# Patient Record
Sex: Male | Born: 1955 | Race: White | Hispanic: No | Marital: Married | Smoking: Never smoker
Health system: Southern US, Community
[De-identification: ages and names within clinical notes are randomized; demographics above are authoritative.]

---

## 1994-05-22 HISTORY — PX: VASECTOMY: SHX75

## 2006-01-06 ENCOUNTER — Emergency Department: Payer: Self-pay | Admitting: Emergency Medicine

## 2010-05-22 HISTORY — PX: OTHER SURGICAL HISTORY: SHX169

## 2020-10-22 ENCOUNTER — Other Ambulatory Visit: Payer: Self-pay | Admitting: Surgery

## 2020-11-03 ENCOUNTER — Other Ambulatory Visit
Admission: RE | Admit: 2020-11-03 | Discharge: 2020-11-03 | Disposition: A | Discharge status: Home / Self Care | Payer: Commercial Managed Care - HMO | Source: Ambulatory Visit | Attending: Surgery | Admitting: Surgery

## 2020-11-03 ENCOUNTER — Other Ambulatory Visit: Payer: Self-pay

## 2020-11-03 NOTE — Patient Instructions (Signed)
Your procedure is scheduled on: Wednesday November 10, 2020. Report to Day [facility] inside [facility] 2nd floor (stop by admissions desk first before getting on elevator) To find out your arrival time please call [phone] between 1PM - 3PM on Tuesday November 09, 2020.  Remember: Instructions that are not followed completely may result in serious medical risk,  up to and including death, or upon the discretion of your surgeon and anesthesiologist your  surgery may need to be rescheduled.     _X__ 1. Do not eat food after midnight the night before your procedure.                 No chewing gum or hard candies. You may drink clear liquids up to 2 hours                 before you are scheduled to arrive for your surgery- DO not drink clear                 liquids within 2 hours of the start of your surgery.                 Clear Liquids include:  water, apple juice without pulp, clear Gatorade, G2 or                  Gatorade Zero (avoid Red/Purple/Blue), Black Coffee or Tea (Do not add                 anything to coffee or tea).  __X__2.  On the morning of surgery brush your teeth with toothpaste and water, you                may rinse your mouth with mouthwash if you wish.  Do not swallow any toothpaste of mouthwash.     _X__ 3.  No Alcohol for 24 hours before or after surgery.   _X__ 4.  Do Not Smoke or use e-cigarettes For 24 Hours Prior to Your Surgery.                 Do not use any chewable tobacco products for at least 6 hours prior to                 Surgery.  _X__  5.  Do not use any recreational drugs (marijuana, cocaine, heroin, ecstasy, MDMA or other)                For at least one week prior to your surgery.  Combination of these drugs with anesthesia                May have life threatening results.  __X__6.  Notify your doctor if there is any change in your medical condition      (cold, fever, infections).     Do not wear jewelry, make-up, hairpins,  clips or nail polish. Do not wear lotions, powders, or perfumes. You may wear deodorant. Do not shave 48 hours prior to surgery. Men may shave face and neck. Do not bring valuables to the hospital.    [facility] is not responsible for any belongings or valuables.  Contacts, dentures or bridgework may not be worn into surgery. Leave your suitcase in the car. After surgery it may be brought to your room. For patients admitted to the hospital, discharge time is determined by your treatment team.   Patients discharged the day of surgery will not be allowed to drive home.  Make arrangements for someone to be with you for the first 24 hours of your Same Day Discharge.  __X__ Take these medicines the morning of surgery with A SIP OF WATER:    1. None  2.   3.   4.  5.  6.  ____ Fleet Enema (as directed)   __X__ Use Antibacterial Soap as directed  ____ Use Benzoyl Peroxide Gel as instructed  ____ Use inhalers on the day of surgery  ____ Stop metformin 2 days prior to surgery    ____ Take 1/2 of usual insulin dose the night before surgery. No insulin the morning          of surgery.   ____ Call your PCP, cardiologist, or Pulmonologist if taking Coumadin/Plavix/aspirin and ask when to stop before your surgery.   __X__ One Week prior to surgery- Stop Anti-inflammatories such as Ibuprofen, Aleve, Advil, Motrin, meloxicam (MOBIC), diclofenac, etodolac, ketorolac, Toradol, Daypro, piroxicam, Goody's or BC powders. OK TO USE TYLENOL IF NEEDED   ____ Stop supplements until after surgery.    ____ Bring C-Pap to the hospital.    If you have any questions regarding your pre-procedure instructions,  Please call Pre-admit Testing at [phone].

## 2020-11-08 ENCOUNTER — Other Ambulatory Visit: Admission: RE | Admit: 2020-11-08 | Payer: Commercial Managed Care - HMO | Source: Ambulatory Visit

## 2020-11-10 ENCOUNTER — Ambulatory Visit: Admission: RE | Admit: 2020-11-10 | Payer: Commercial Managed Care - HMO | Source: Home / Self Care | Admitting: Surgery

## 2020-11-10 ENCOUNTER — Encounter: Admission: RE | Payer: Self-pay | Source: Home / Self Care

## 2020-11-10 SURGERY — ARTHROSCOPY, KNEE, WITH MEDIAL MENISCECTOMY
Anesthesia: Choice | Site: Knee | Laterality: Left

## 2021-01-20 ENCOUNTER — Other Ambulatory Visit: Payer: Self-pay | Admitting: Surgery

## 2021-01-20 DIAGNOSIS — S83232D Complex tear of medial meniscus, current injury, left knee, subsequent encounter: Secondary | ICD-10-CM

## 2021-02-01 ENCOUNTER — Ambulatory Visit
Admission: RE | Admit: 2021-02-01 | Discharge: 2021-02-01 | Disposition: A | Discharge status: Home / Self Care | Payer: Medicare Other | Source: Ambulatory Visit | Attending: Surgery | Admitting: Surgery

## 2021-02-01 ENCOUNTER — Other Ambulatory Visit: Payer: Self-pay

## 2021-02-01 DIAGNOSIS — S83232D Complex tear of medial meniscus, current injury, left knee, subsequent encounter: Secondary | ICD-10-CM | POA: Diagnosis present

## 2021-02-10 ENCOUNTER — Other Ambulatory Visit: Payer: Self-pay | Admitting: Surgery

## 2021-02-22 ENCOUNTER — Other Ambulatory Visit
Admission: RE | Admit: 2021-02-22 | Discharge: 2021-02-22 | Disposition: A | Discharge status: Home / Self Care | Payer: Medicare Other | Source: Ambulatory Visit | Attending: Surgery | Admitting: Surgery

## 2021-02-22 ENCOUNTER — Other Ambulatory Visit: Payer: Self-pay

## 2021-02-22 NOTE — Patient Instructions (Addendum)
Your procedure is scheduled on: Wednesday 03/02/21 Report to the Registration Desk on the 1st floor of the [facility]. To find out your arrival time, please call [phone] between 1PM - 3PM on: Tuesday 03/01/21  REMEMBER: Instructions that are not followed completely may result in serious medical risk, up to and including death; or upon the discretion of your surgeon and anesthesiologist your surgery may need to be rescheduled.  Do not eat food after midnight the night before surgery.  No gum chewing, lozengers or hard candies.  You may however, drink CLEAR liquids up to 2 hours before you are scheduled to arrive for your surgery. Do not drink anything within 2 hours of your scheduled arrival time.  Clear liquids include: - water  - apple juice without pulp - gatorade (not RED, PURPLE, OR BLUE) - black coffee or tea (Do NOT add milk or creamers to the coffee or tea) Do NOT drink anything that is not on this list.  Dr. Joice Lofts ordered a Pre-Surgery Carb drink for the morning of surgery. The drink has to be finished prior to the 2 hour arrival scheduled time.  One week prior to surgery: On Wednesday 02/23/21 stop taking Diclofenac Sodium Stop Anti-inflammatories (NSAIDS) such as Advil, Aleve, Ibuprofen, Motrin, Naproxen, Naprosyn and Aspirin based products such as Excedrin, Goodys Powder, BC Powder. Stop ANY OVER THE COUNTER supplements until after surgery. You may however, continue to take Tylenol if needed for pain up until the day of surgery.  No Alcohol for 24 hours before or after surgery.  No Smoking including e-cigarettes for 24 hours prior to surgery.  No chewable tobacco products for at least 6 hours prior to surgery.  No nicotine patches on the day of surgery.  Do not use any "recreational" drugs for at least a week prior to your surgery.  Please be advised that the combination of cocaine and anesthesia may have negative outcomes, up to and including death. If you test  positive for cocaine, your surgery will be cancelled.  On the morning of surgery brush your teeth with toothpaste and water, you may rinse your mouth with mouthwash if you wish. Do not swallow any toothpaste or mouthwash.  Use CHG Soap or wipes as directed on instruction sheet.  Do not wear jewelry.  Do not wear lotions, powders, or perfumes.   Do not shave body from the neck down 48 hours prior to surgery just in case you cut yourself which could leave a site for infection.  Also, freshly shaved skin may become irritated if using the CHG soap.  Do not bring valuables to the hospital. [facility] is not responsible for any missing/lost belongings or valuables.   Bring your C-PAP to the hospital with you in case you may have to spend the night.   Notify your doctor if there is any change in your medical condition (cold, fever, infection).  Wear comfortable clothing (specific to your surgery type) to the hospital.  After surgery, you can help prevent lung complications by doing breathing exercises.  Take deep breaths and cough every 1-2 hours. Your doctor may order a device called an Incentive Spirometer to help you take deep breaths.  If you are being discharged the day of surgery, you will not be allowed to drive home. You will need a responsible adult (18 years or older) to drive you home and stay with you that night.   If you are taking public transportation, you will need to have a responsible  adult (18 years or older) with you. Please confirm with your physician that it is acceptable to use public transportation.   Please call the Pre-admissions Testing Dept. at [phone] if you have any questions about these instructions.  Surgery Visitation Policy:  Patients undergoing a surgery or procedure may have one family member or support person with them as long as that person is not COVID-19 positive or experiencing its symptoms.  That person may remain in the waiting area  during the procedure and may rotate out with other people.  Inpatient Visitation:    Visiting hours are 7 a.m. to 8 p.m. Up to two visitors ages 16+ are allowed at one time in a patient room. The visitors may rotate out with other people during the day. Visitors must check out when they leave, or other visitors will not be allowed. One designated support person may remain overnight. The visitor must pass COVID-19 screenings, use hand sanitizer when entering and exiting the patient's room and wear a mask at all times, including in the patient's room. Patients must also wear a mask when staff or their visitor are in the room. Masking is required regardless of vaccination status.

## 2021-02-24 ENCOUNTER — Other Ambulatory Visit
Admission: RE | Admit: 2021-02-24 | Discharge: 2021-02-24 | Disposition: A | Discharge status: Home / Self Care | Payer: Medicare Other | Source: Ambulatory Visit | Attending: Surgery | Admitting: Surgery

## 2021-02-24 ENCOUNTER — Other Ambulatory Visit: Payer: Self-pay

## 2021-02-24 DIAGNOSIS — Z01810 Encounter for preprocedural cardiovascular examination: Secondary | ICD-10-CM | POA: Insufficient documentation

## 2021-03-01 ENCOUNTER — Other Ambulatory Visit: Payer: Medicare Other

## 2021-03-01 MED ORDER — ORAL CARE MOUTH RINSE: 15.0000 mL | Freq: Once | OROMUCOSAL | Status: AC

## 2021-03-01 MED ORDER — LACTATED RINGERS IV SOLN: INTRAVENOUS | Status: DC

## 2021-03-01 MED ORDER — CEFAZOLIN SODIUM-DEXTROSE 2-4 GM/100ML-% IV SOLN
2.0000 g | INTRAVENOUS | Status: DC
Start: 2021-03-02 — End: 2021-03-01

## 2021-03-01 MED ORDER — CEFAZOLIN SODIUM-DEXTROSE 2-4 GM/100ML-% IV SOLN
2.0000 g | INTRAVENOUS | Status: AC
  Administered 2021-03-02: 2 g via INTRAVENOUS

## 2021-03-01 MED ORDER — FAMOTIDINE 20 MG PO TABS: 20.0000 mg | ORAL_TABLET | Freq: Once | ORAL | Status: AC

## 2021-03-01 MED ORDER — CHLORHEXIDINE GLUCONATE 0.12 % MT SOLN
15.0000 mL | Freq: Once | OROMUCOSAL | Status: AC
Start: 2021-03-01 — End: 2021-03-02

## 2021-03-02 ENCOUNTER — Ambulatory Visit
Admission: RE | Admit: 2021-03-02 | Discharge: 2021-03-02 | Disposition: A | Discharge status: Home / Self Care | Payer: Medicare Other | Source: Ambulatory Visit | Attending: Surgery | Admitting: Surgery

## 2021-03-02 ENCOUNTER — Encounter: Payer: Self-pay | Admitting: Surgery

## 2021-03-02 ENCOUNTER — Other Ambulatory Visit: Payer: Self-pay

## 2021-03-02 ENCOUNTER — Ambulatory Visit: Payer: Medicare Other | Admitting: Anesthesiology

## 2021-03-02 ENCOUNTER — Encounter
Admission: RE | Disposition: A | Discharge status: Home / Self Care | Payer: Self-pay | Source: Ambulatory Visit | Attending: Surgery

## 2021-03-02 DIAGNOSIS — S83232A Complex tear of medial meniscus, current injury, left knee, initial encounter: Secondary | ICD-10-CM | POA: Insufficient documentation

## 2021-03-02 DIAGNOSIS — Z791 Long term (current) use of non-steroidal anti-inflammatories (NSAID): Secondary | ICD-10-CM | POA: Diagnosis not present

## 2021-03-02 DIAGNOSIS — X58XXXA Exposure to other specified factors, initial encounter: Secondary | ICD-10-CM | POA: Insufficient documentation

## 2021-03-02 HISTORY — PX: KNEE ARTHROSCOPY WITH MEDIAL MENISECTOMY: SHX5651

## 2021-03-02 SURGERY — ARTHROSCOPY, KNEE, WITH MEDIAL MENISCECTOMY
Anesthesia: General | Site: Knee | Laterality: Left

## 2021-03-02 MED ORDER — ACETAMINOPHEN 10 MG/ML IV SOLN
INTRAVENOUS | Status: AC
  Filled 2021-03-02: qty 100

## 2021-03-02 MED ORDER — MIDAZOLAM HCL 2 MG/2ML IJ SOLN
INTRAMUSCULAR | Status: AC
  Filled 2021-03-02: qty 2

## 2021-03-02 MED ORDER — FAMOTIDINE 20 MG PO TABS
ORAL_TABLET | ORAL | Status: AC
  Administered 2021-03-02: 20 mg via ORAL
  Filled 2021-03-02: qty 1

## 2021-03-02 MED ORDER — EPHEDRINE SULFATE 50 MG/ML IJ SOLN
INTRAMUSCULAR | Status: DC | PRN
Start: 2021-03-02 — End: 2021-03-02
  Administered 2021-03-02: 10 mg via INTRAVENOUS

## 2021-03-02 MED ORDER — LIDOCAINE HCL (CARDIAC) PF 100 MG/5ML IV SOSY
PREFILLED_SYRINGE | INTRAVENOUS | Status: DC | PRN
Start: 2021-03-02 — End: 2021-03-02
  Administered 2021-03-02: 100 mg via INTRAVENOUS

## 2021-03-02 MED ORDER — PHENYLEPHRINE HCL (PRESSORS) 10 MG/ML IV SOLN
INTRAVENOUS | Status: AC
  Filled 2021-03-02: qty 2

## 2021-03-02 MED ORDER — FENTANYL CITRATE (PF) 100 MCG/2ML IJ SOLN
INTRAMUSCULAR | Status: AC
  Filled 2021-03-02: qty 2

## 2021-03-02 MED ORDER — ACETAMINOPHEN 10 MG/ML IV SOLN
INTRAVENOUS | Status: DC | PRN
  Administered 2021-03-02: 1000 mg via INTRAVENOUS

## 2021-03-02 MED ORDER — PROPOFOL 1000 MG/100ML IV EMUL
INTRAVENOUS | Status: AC
  Filled 2021-03-02: qty 200

## 2021-03-02 MED ORDER — FENTANYL CITRATE (PF) 100 MCG/2ML IJ SOLN
INTRAMUSCULAR | Status: DC | PRN
  Administered 2021-03-02 (×4): 25 ug via INTRAVENOUS

## 2021-03-02 MED ORDER — BUPIVACAINE-EPINEPHRINE (PF) 0.5% -1:200000 IJ SOLN
INTRAMUSCULAR | Status: AC
  Filled 2021-03-02: qty 30

## 2021-03-02 MED ORDER — PROPOFOL 10 MG/ML IV BOLUS
INTRAVENOUS | Status: DC | PRN
  Administered 2021-03-02: 200 mg via INTRAVENOUS

## 2021-03-02 MED ORDER — CHLORHEXIDINE GLUCONATE 0.12 % MT SOLN
OROMUCOSAL | Status: AC
  Administered 2021-03-02: 15 mL via OROMUCOSAL
  Filled 2021-03-02: qty 15

## 2021-03-02 MED ORDER — RINGERS IRRIGATION IR SOLN
Status: DC | PRN
  Administered 2021-03-02: 3000 mL

## 2021-03-02 MED ORDER — LIDOCAINE HCL 1 % IJ SOLN
INTRAMUSCULAR | Status: DC | PRN
  Administered 2021-03-02: 60 mL via INTRAMUSCULAR

## 2021-03-02 MED ORDER — BUPIVACAINE-EPINEPHRINE (PF) 0.5% -1:200000 IJ SOLN
INTRAMUSCULAR | Status: DC | PRN
  Administered 2021-03-02: 30 mL

## 2021-03-02 MED ORDER — MIDAZOLAM HCL 2 MG/2ML IJ SOLN
INTRAMUSCULAR | Status: DC | PRN
  Administered 2021-03-02: 2 mg via INTRAVENOUS

## 2021-03-02 MED ORDER — LIDOCAINE HCL (PF) 1 % IJ SOLN
INTRAMUSCULAR | Status: AC
  Filled 2021-03-02: qty 30

## 2021-03-02 MED ORDER — ONDANSETRON HCL 4 MG/2ML IJ SOLN
INTRAMUSCULAR | Status: DC | PRN
  Administered 2021-03-02: 4 mg via INTRAVENOUS

## 2021-03-02 MED ORDER — CEFAZOLIN SODIUM-DEXTROSE 2-4 GM/100ML-% IV SOLN
INTRAVENOUS | Status: AC
  Filled 2021-03-02: qty 100

## 2021-03-02 MED ORDER — GLYCOPYRROLATE 0.2 MG/ML IJ SOLN
INTRAMUSCULAR | Status: DC | PRN
  Administered 2021-03-02: .2 mg via INTRAVENOUS

## 2021-03-02 MED ORDER — DEXAMETHASONE SODIUM PHOSPHATE 10 MG/ML IJ SOLN
INTRAMUSCULAR | Status: DC | PRN
  Administered 2021-03-02: 10 mg via INTRAVENOUS

## 2021-03-02 MED ORDER — HYDROCODONE-ACETAMINOPHEN 5-325 MG PO TABS: 1.0000 | ORAL_TABLET | Freq: Four times a day (QID) | ORAL | 0 refills | Status: AC | PRN

## 2021-03-02 MED ORDER — SODIUM CHLORIDE 0.9 % IV SOLN
INTRAVENOUS | Status: DC | PRN
  Administered 2021-03-02: 25 ug/min via INTRAVENOUS

## 2021-03-02 SURGICAL SUPPLY — 38 items
APL PRP STRL LF DISP 70% ISPRP (MISCELLANEOUS) ×1
BAG COUNTER SPONGE SURGICOUNT (BAG) IMPLANT
BAG SPNG CNTER NS LX DISP (BAG)
BLADE FULL RADIUS 3.5 (BLADE) ×2 IMPLANT
BLADE SHAVER 4.5X7 STR FR (MISCELLANEOUS) ×2 IMPLANT
BNDG ELASTIC 6X5.8 VLCR STR LF (GAUZE/BANDAGES/DRESSINGS) ×2 IMPLANT
BNDG ESMARK 6X12 TAN STRL LF (GAUZE/BANDAGES/DRESSINGS) ×2 IMPLANT
CHLORAPREP W/TINT 26 (MISCELLANEOUS) ×2 IMPLANT
CUFF TOURN SGL QUICK 24 (TOURNIQUET CUFF)
CUFF TOURN SGL QUICK 34 (TOURNIQUET CUFF)
CUFF TRNQT CYL 24X4X16.5-23 (TOURNIQUET CUFF) IMPLANT
CUFF TRNQT CYL 34X4.125X (TOURNIQUET CUFF) IMPLANT
DRAPE IMP U-DRAPE 54X76 (DRAPES) ×2 IMPLANT
ELECT REM PT RETURN 9FT ADLT (ELECTROSURGICAL) ×2
ELECTRODE REM PT RTRN 9FT ADLT (ELECTROSURGICAL) ×1 IMPLANT
GAUZE SPONGE 4X4 12PLY STRL (GAUZE/BANDAGES/DRESSINGS) ×2 IMPLANT
GLOVE SURG ENC MOIS LTX SZ8 (GLOVE) ×4 IMPLANT
GLOVE SURG ENC TEXT LTX SZ7 (GLOVE) ×4 IMPLANT
GLOVE SURG UNDER LTX SZ8 (GLOVE) ×2 IMPLANT
GLOVE SURG UNDER POLY LF SZ7.5 (GLOVE) ×2 IMPLANT
GOWN STRL REUS W/ TWL LRG LVL3 (GOWN DISPOSABLE) ×1 IMPLANT
GOWN STRL REUS W/ TWL XL LVL3 (GOWN DISPOSABLE) ×2 IMPLANT
GOWN STRL REUS W/TWL LRG LVL3 (GOWN DISPOSABLE) ×2
GOWN STRL REUS W/TWL XL LVL3 (GOWN DISPOSABLE) ×4
IV LACTATED RINGER IRRG 3000ML (IV SOLUTION) ×2
IV LR IRRIG 3000ML ARTHROMATIC (IV SOLUTION) ×1 IMPLANT
KIT TURNOVER KIT A (KITS) ×2 IMPLANT
MANIFOLD NEPTUNE II (INSTRUMENTS) ×2 IMPLANT
NEEDLE HYPO 21X1.5 SAFETY (NEEDLE) ×2 IMPLANT
PACK ARTHROSCOPY KNEE (MISCELLANEOUS) ×2 IMPLANT
PENCIL ELECTRO HAND CTR (MISCELLANEOUS) ×2 IMPLANT
SPONGE T-LAP 18X18 ~~LOC~~+RFID (SPONGE) ×2 IMPLANT
SUT PROLENE 4 0 PS 2 18 (SUTURE) ×2 IMPLANT
SUT TICRON COATED BLUE 2 0 30 (SUTURE) IMPLANT
SYR 50ML LL SCALE MARK (SYRINGE) ×2 IMPLANT
TUBING INFLOW SET DBFLO PUMP (TUBING) ×2 IMPLANT
WAND WEREWOLF FLOW 90D (MISCELLANEOUS) ×2 IMPLANT
WATER STERILE IRR 500ML POUR (IV SOLUTION) IMPLANT

## 2021-03-02 NOTE — Transfer of Care (Signed)
Immediate Anesthesia Transfer of Care Note  Patient: Lawrence Mason  Procedure(s) Performed: LEFT KNEE ARTHROSCOPY WITH  PARTIAL MEDIAL MENISCECTOMY (Left: Knee)  Patient Location: PACU  Anesthesia Type:General  Level of Consciousness: awake, drowsy and patient cooperative  Airway & Oxygen Therapy: Patient Spontanous Breathing and Patient connected to face mask oxygen  Post-op Assessment: Report given to RN and Post -op Vital signs reviewed and stable  Post vital signs: Reviewed and stable  Last Vitals:  Vitals Value Taken Time  BP 132/85 03/02/21 0843  Temp    Pulse 64 03/02/21 0844  Resp 12 03/02/21 0844  SpO2 100 % 03/02/21 0844  Vitals shown include unvalidated device data.  Last Pain:  Vitals:   03/02/21 0651  TempSrc: Oral  PainSc: 0-No pain         Complications: No notable events documented.

## 2021-03-02 NOTE — Progress Notes (Signed)
Called patient regarding 6AM arrival time for his procedure. Unable to reach patient.

## 2021-03-02 NOTE — Anesthesia Procedure Notes (Signed)
Procedure Name: LMA Insertion Date/Time: 03/02/2021 7:35 AM Performed by: Mohammed Kindle, CRNA Pre-anesthesia Checklist: Patient identified, Emergency Drugs available, Suction available and Patient being monitored Patient Re-evaluated:Patient Re-evaluated prior to induction Oxygen Delivery Method: Circle system utilized Preoxygenation: Pre-oxygenation with 100% oxygen Induction Type: IV induction LMA: LMA inserted LMA Size: 4.0 Number of attempts: 1 Placement Confirmation: positive ETCO2, CO2 detector and breath sounds checked- equal and bilateral Tube secured with: Tape Dental Injury: Teeth and Oropharynx as per pre-operative assessment

## 2021-03-02 NOTE — Discharge Instructions (Addendum)
Orthopedic discharge instructions: Keep dressing dry and intact.  May shower after dressing changed on post-op day #4 (Sunday).  Cover sutures with Band-Aids after drying off. Apply ice frequently to knee. Take Diclofenac ER 100 mg daily OR ibuprofen 600-800 mg TID with meals for 7-10 days, then as necessary. Take pain medication as prescribed or ES Tylenol when needed.  May weight-bear as tolerated - use crutches or walker as needed. Follow-up in 10-14 days or as scheduled.  AMBULATORY SURGERY  DISCHARGE INSTRUCTIONS   The drugs that you were given will stay in your system until tomorrow so for the next 24 hours you should not:  Drive an automobile Make any legal decisions Drink any alcoholic beverage   You may resume regular meals tomorrow.  Today it is better to start with liquids and gradually work up to solid foods.  You may eat anything you prefer, but it is better to start with liquids, then soup and crackers, and gradually work up to solid foods.   Please notify your doctor immediately if you have any unusual bleeding, trouble breathing, redness and pain at the surgery site, drainage, fever, or pain not relieved by medication.    Additional Instructions:        Please contact your physician with any problems or Same Day Surgery at [phone], Monday through Friday 6 am to 4 pm, or [place] at [facility] number at [phone].

## 2021-03-02 NOTE — Anesthesia Preprocedure Evaluation (Addendum)
Anesthesia Evaluation  Patient identified by MRN, date of birth, ID band Patient awake    Reviewed: Allergy & Precautions, H&P , NPO status , Patient's Chart, lab work & pertinent test results, reviewed documented beta blocker date and time   Airway Mallampati: III   Neck ROM: full    Dental  (+) Poor Dentition, Teeth Intact   Pulmonary neg pulmonary ROS,    Pulmonary exam normal        Cardiovascular Exercise Tolerance: Poor negative cardio ROS Normal cardiovascular exam Rhythm:regular Rate:Normal     Neuro/Psych negative neurological ROS  negative psych ROS   GI/Hepatic negative GI ROS, Neg liver ROS,   Endo/Other  negative endocrine ROS  Renal/GU negative Renal ROS  negative genitourinary   Musculoskeletal   Abdominal   Peds  Hematology negative hematology ROS (+)   Anesthesia Other Findings History reviewed. No pertinent past medical history. Past Surgical History: 2012: arthroscope; Right     Comment:  Right Knee 1996: VASECTOMY; Bilateral BMI    Body Mass Index: 26.61 kg/m     Reproductive/Obstetrics negative OB ROS                             Anesthesia Physical Anesthesia Plan  ASA: 2  Anesthesia Plan: Spinal and General LMA   Post-op Pain Management:    Induction:   PONV Risk Score and Plan: 3  Airway Management Planned:   Additional Equipment:   Intra-op Plan:   Post-operative Plan:   Informed Consent: I have reviewed the patients History and Physical, chart, labs and discussed the procedure including the risks, benefits and alternatives for the proposed anesthesia with the patient or authorized representative who has indicated his/her understanding and acceptance.     Dental Advisory Given  Plan Discussed with: CRNA  Anesthesia Plan Comments:        Anesthesia Quick Evaluation

## 2021-03-02 NOTE — Op Note (Signed)
03/02/2021  9:00 AM  Patient:   Lawrence Mason  Pre-Op Diagnosis:   Complex tear of medial meniscus of left knee.  Postoperative diagnosis:   Same  Procedure:   Arthroscopic debridement with partial medial meniscectomy, left knee.  Surgeon:   Maryagnes Amos, MD  Assistant:   Frederic Jericho, PA-S  Anesthesia:   General LMA  Findings:   As above. There was a complex tear involving the posterior portion of the medial meniscus with an unstable flap component. The lateral meniscus was in excellent condition, as were the anterior and posterior cruciate ligaments. There were grade 1-2 chondromalacial changes involving the central patella and weightbearing portion of the medial femoral condyle, and grade 2 chondromalacial changes involving the more lateral portion of the medial tibial plateau. The lateral compartment appeared to be in excellent condition.  Complications:   None  EBL:   5 cc.  Total fluids:   900 cc of crystalloid.  Tourniquet time:   None  Drains:   None  Closure:   4-0 Prolene interrupted sutures.  Brief clinical note:   The patient is a 65 year old male with a nearly 1 year history of medial sided right knee pain following an apparent running injury. His symptoms have persisted despite medications, activity modification, etc. His history and examination are consistent with a medial meniscus tear, confirmed by MRI scan. The patient presents at this time for arthroscopy, debridement, and repair versus partial medial meniscectomy.  Procedure:   The patient was brought into the operating room and lain in the supine position. After adequate general laryngeal mask anesthesia was obtained, a timeout was performed to verify the appropriate side. The patient's left knee was injected sterilely using a solution of 30 cc of 1% lidocaine and 30 cc of 0.5% Sensorcaine with epinephrine. The left lower extremity was prepped with ChloraPrep solution before being draped sterilely.  Preoperative antibiotics were administered. The expected portal sites were injected with 0.5% Sensorcaine with epinephrine before the camera was placed in the anterolateral portal and instrumentation performed through the anteromedial portal.   The knee was sequentially examined beginning in the suprapatellar pouch, then progressing to the patellofemoral space, the medial gutter and compartment, the notch, and finally the lateral compartment and gutter. The findings were as described above. Abundant reactive synovial tissues anteriorly were debrided using the full-radius resector in order to improve visualization. The torn portion of the medial meniscus was debrided back to stable margins using a combination of the straight and up-biting baskets and the full-radius resector.  Subsequent probing of the remaining rim demonstrated good stability. 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 The instruments were removed from the joint after suctioning the excess fluid.   The portal sites were closed using 4-0 Prolene interrupted sutures before a sterile bulky dressing was applied to the knee. The patient was then awakened, extubated, and returned to the recovery room in satisfactory condition after tolerating the procedure well.

## 2021-03-02 NOTE — H&P (Signed)
History of Present Illness: Lawrence Mason is a 65 y.o. who presents today today for history and physical. He is to undergo a left knee arthroscopy on 03/02/2021. Last seen in our clinic on 10/15/2020. There is been no change in his condition since that time.  The symptoms began 10 months ago and developed without any clear injury, although he did notice the symptoms developed after going for a run shortly before Christmas. Several nonsurgical treatment options were discussed at this visit. However, because of continued symptoms after trying to run again, he elected to pursue an obtaining an MRI scan. He has had difficulty obtaining approval for the MRI scan from his insurance company, so has not yet had this study. He notes little change in his symptoms over the past several months. He reports 2/10 pain on today's visit. The pain is located along the medial aspect of the knee. The pain is described as aching, dull and stabbing. The symptoms are aggravated at higher levels of activity, walking, standing pivot and exercising. He also describes occasional catching in his knee, but denies any true locking or giving way. He has associated mild swelling, but no deformity. He has tried anti-inflammatories and ice with temporary partial relief.  Past Medical History: History reviewed. No pertinent past medical history.  Past Surgical History:  KNEE ARTHROSCOPY   VASECTOMY Bilateral 1996   VASECTOMY   Past Family History:  Multiple myeloma Mother   Atrial fibrillation - Father   Coronary Artery Disease (Blocked arteries around heart) Neg Hx   Diabetes type II Neg Hx   Stroke Neg Hx   Prostate cancer Neg Hx   Colon cancer Neg Hx   Alzheimer's disease Neg Hx   Medications:  diclofenac (VOLTAREN) 100 mg XR tablet Take 1 tablet (100 mg total) by mouth once daily as needed (Muscle/joint pain.) 30 tablet 11   diclofenac (VOLTAREN) 100 mg XR tablet Take by mouth   ibuprofen (MOTRIN) 200 MG tablet  Take by mouth   tadalafiL (CIALIS) 5 MG tablet Take 1 tablet (5 mg total) by mouth once daily 30 tablet 5   tadalafiL (CIALIS) 5 MG tablet Take by mouth   Allergies: No Known Allergies   Review of Systems: A comprehensive 14 point ROS was performed, reviewed, and the pertinent orthopaedic findings are documented in the HPI.  Physical Exam: BP 122/80 (BP Location: Left upper arm, Patient Position: Sitting, BP Cuff Size: Adult)  Ht 172.7 cm (5' 7.99")  Wt 80.5 kg (177 lb 6.4 oz)  BMI 26.98 kg/m   General: Well-developed well-nourished male seen in no acute distress.   HEENT: Atraumatic,normocephalic. Pupils are equal and reactive to light. Oropharynx is clear with moist mucosa  Lungs: Clear to auscultation bilaterally   Cardiovascular: Regular rate and rhythm. Normal S1, S2. No murmurs. No appreciable gallops or rubs. Peripheral pulses are palpable.  Abdomen: Soft, non-tender, nondistended. Bowel sounds present  Left knee exam: GAIT: mild limp and uses no assistive devices. ALIGNMENT: normal SKIN: unremarkable SWELLING: minimal EFFUSION: trace WARMTH: no warmth TENDERNESS: mild over the medial joint line ROM: 0 to 125 degrees with moderate pain in maximal flexion and mild pain in maximal extension McMURRAY'S: positive PATELLOFEMORAL: normal tracking with no peri-patellar tenderness and negative apprehension sign CREPITUS: no LACHMAN'S: negative PIVOT SHIFT: negative ANTERIOR DRAWER: negative POSTERIOR DRAWER: negative VARUS/VALGUS: stable  Neurological: The patient is alert and oriented Sensation to light touch appears to be intact and within normal limits Gross motor strength appeared to be  equal to 5/5  Vascular : Peripheral pulses felt to be palpable. Capillary refill appears to be intact and within normal limits  MRI OF THE LEFT KNEE: 1. Complex tear of the midbody and posterior horn medial meniscus including a flap of meniscal tissue extending cephalad  from the posterior root adjacent to the PCL. 2. Mild levels of chondral thinning in the medial compartment and patellofemoral joint.  Impression: Complex medial meniscus tear, left knee.  Plan: The treatment options, including both surgical and nonsurgical choices, have been discussed in detail with the patient. The patient would like to proceed with surgical intervention to include a left knee arthroscopy with debridement versus partial medial meniscectomy. The risks (including bleeding, infection, nerve and/or blood vessel injury, persistent or recurrent pain, loosening or failure of the components, leg length inequality, dislocation, need for further surgery, blood clots, strokes, heart attacks or arrhythmias, pneumonia, etc.) and benefits of the surgical procedure were discussed. The patient states his/her understanding and agrees to proceed. A formal written consent will be obtained by the nursing staff.   H&P reviewed and patient re-examined. No changes.

## 2021-03-03 ENCOUNTER — Inpatient Hospital Stay: Admission: RE | Admit: 2021-03-03 | Payer: Medicare Other | Source: Ambulatory Visit

## 2021-03-03 NOTE — Anesthesia Postprocedure Evaluation (Signed)
Anesthesia Post Note  Patient: Lawrence Mason  Procedure(s) Performed: LEFT KNEE ARTHROSCOPY WITH  PARTIAL MEDIAL MENISCECTOMY (Left: Knee)  Patient location during evaluation: PACU Anesthesia Type: Spinal Level of consciousness: awake and alert Pain management: pain level controlled Vital Signs Assessment: post-procedure vital signs reviewed and stable Respiratory status: spontaneous breathing, nonlabored ventilation, respiratory function stable and patient connected to nasal cannula oxygen Cardiovascular status: blood pressure returned to baseline and stable Postop Assessment: no apparent nausea or vomiting Anesthetic complications: no   No notable events documented.   Last Vitals:  Vitals:   03/02/21 0915 03/02/21 0928  BP: 120/86 (!) 130/96  Pulse: 72 67  Resp: 18 18  Temp: 36.7 C (!) 36.3 C  SpO2: 100% 99%    Last Pain:  Vitals:   03/03/21 0819  TempSrc:   PainSc: 0-No pain                 Yevette Edwards

## 2022-11-29 IMAGING — MR MR KNEE*L* W/O CM
6 series · 40 of 40 positions shown · non-contrast
Comparison: None.

CLINICAL DATA: Left knee pain for 9 months.  Running athlete.

EXAM:
MRI OF THE LEFT KNEE WITHOUT CONTRAST
TECHNIQUE: Multiplanar, multisequence MR imaging of the knee was performed. No
intravenous contrast was administered.

[Series 3: T2 fat-sat · axial · 4.0mm · 0.50mm/px · z∈[-107,+63]mm · 8 of 35 slices shown (1 of 3)]
[im 1/35]
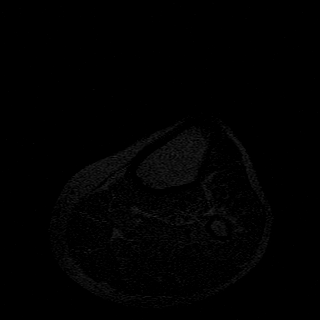
[im 5/35]
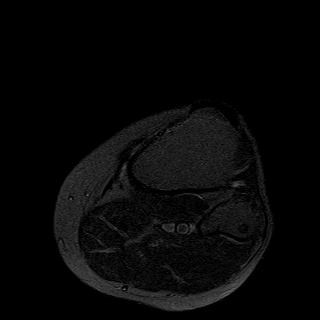
[im 10/35]
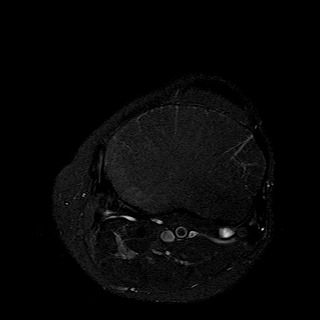
[im 15/35]
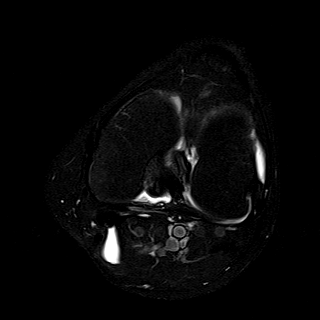
[im 20/35]
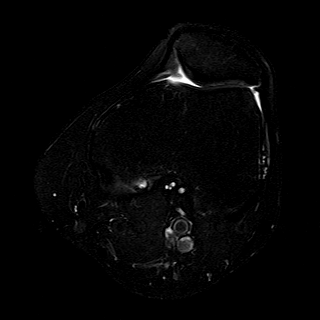
[im 25/35]
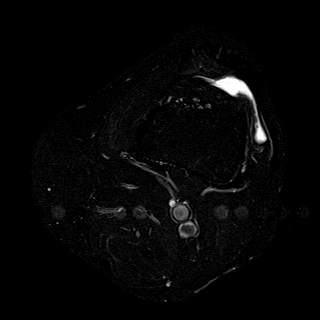
[im 30/35]
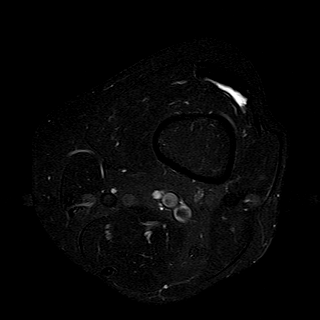
[im 35/35]
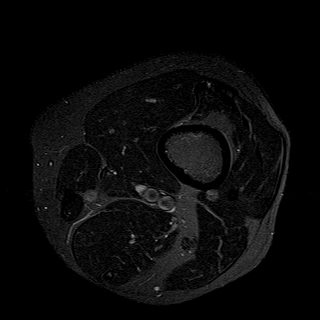

[Series 4: T1 · coronal · 4.0mm · 0.59mm/px · 6 of 31 slices shown]
[im 1/31]
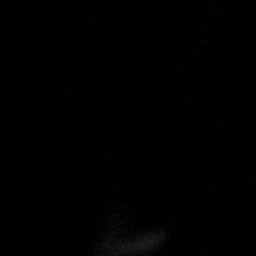
[im 7/31]
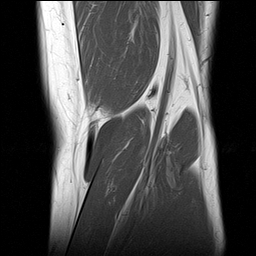
[im 13/31]
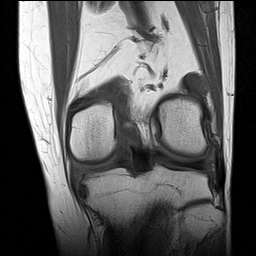
[im 19/31]
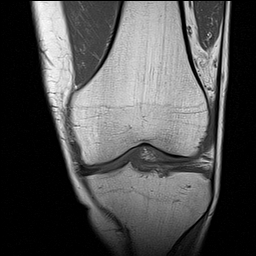
[im 25/31]
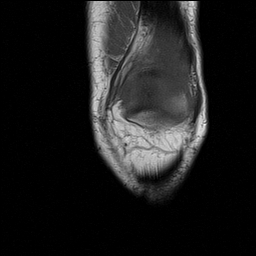
[im 31/31]
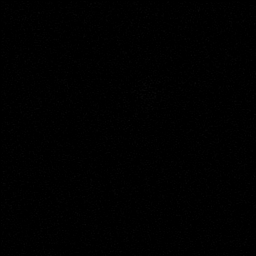

[Series 5: PD fat-sat · sagittal · 3.0mm · 0.59mm/px · 7 of 35 slices shown (1 of 2)]
[im 1/35]
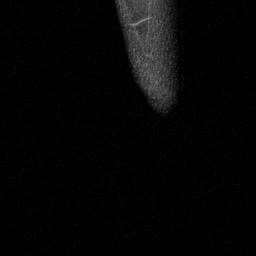
[im 6/35]
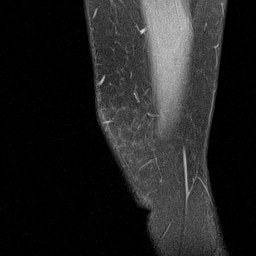
[im 12/35]
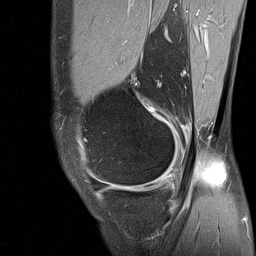
[im 18/35]
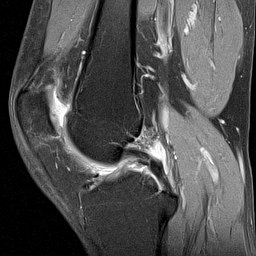
[im 23/35]
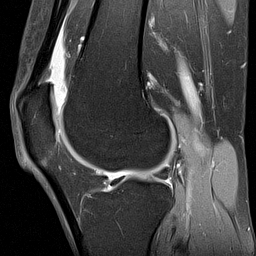
[im 29/35]
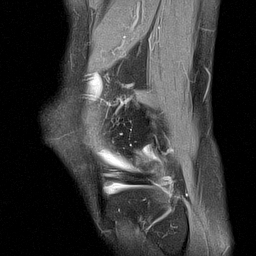
[im 35/35]
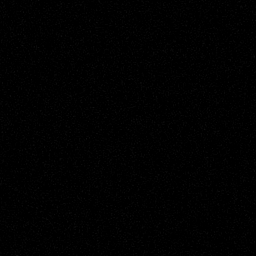

[Series 6: T2 fat-sat · coronal · 4.0mm · 0.59mm/px · 6 of 31 slices shown (2 of 3)]
[im 1/31]
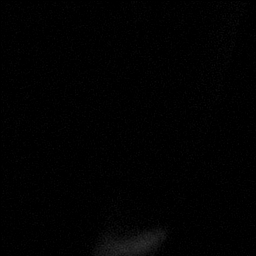
[im 7/31]
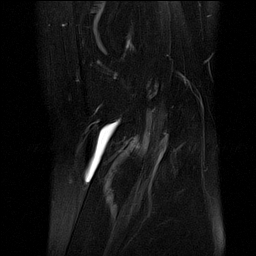
[im 13/31]
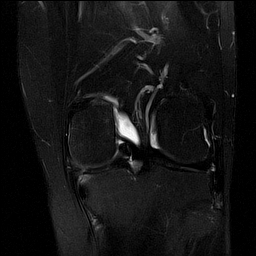
[im 19/31]
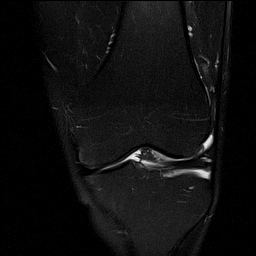
[im 25/31]
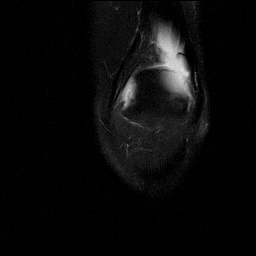
[im 31/31]
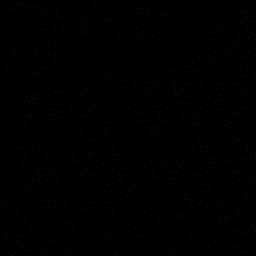

[Series 7: T2 fat-sat · sagittal · 3.0mm · 0.59mm/px · 7 of 35 slices shown (3 of 3)]
[im 1/35]
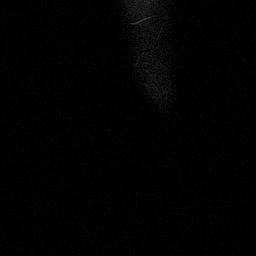
[im 6/35]
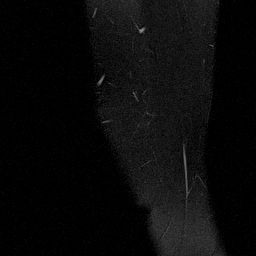
[im 12/35]
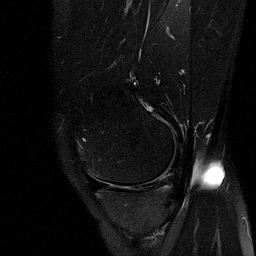
[im 18/35]
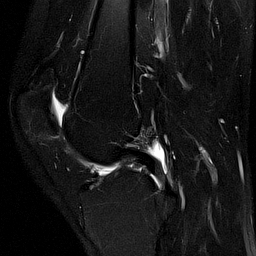
[im 23/35]
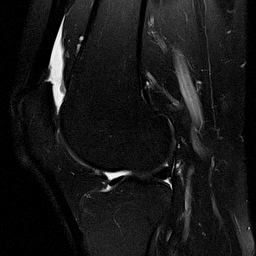
[im 29/35]
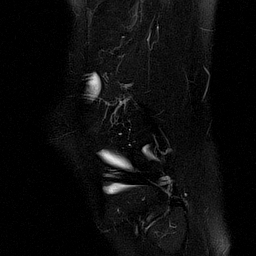
[im 35/35]
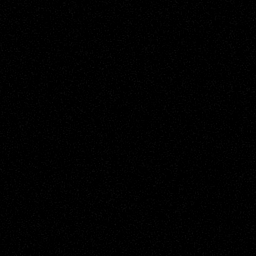

[Series 8: PD fat-sat · coronal · 4.0mm · 0.59mm/px · 6 of 31 slices shown (2 of 2)]
[im 1/31]
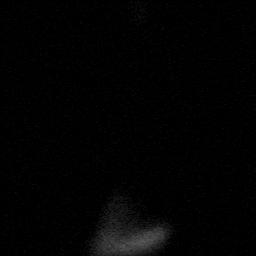
[im 7/31]
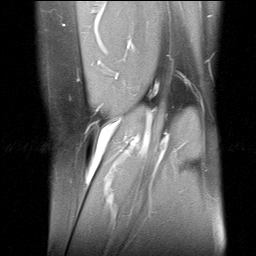
[im 13/31]
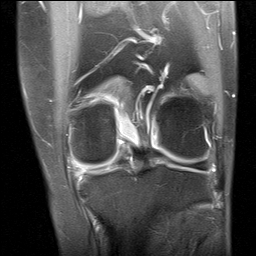
[im 19/31]
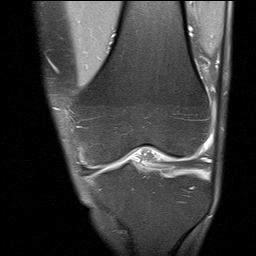
[im 25/31]
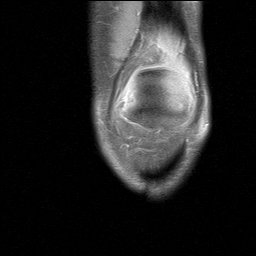
[im 31/31]
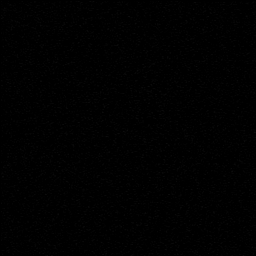

[40 of 40 positions shown; findings below may reference images not displayed]

FINDINGS: MENISCI

Medial meniscus: Complex tear of the midbody and posterior horn
medial meniscus with amorphous and irregular meniscal elements in
the involved region along with a flap of meniscal tissue extending
cephalad from the posterior root region adjacent to the PCL on image
13 series 6.

Lateral meniscus:  Unremarkable

LIGAMENTS

Cruciates:  Unremarkable

Collaterals:  Unremarkable

CARTILAGE

Patellofemoral:  Minimal chondral thinning.

Medial: Mild chondral thinning. Subtle endosteal marrow edema along
the posteromedial tibial rim adjacent the meniscal tear.

Lateral:  Unremarkable

Joint:  Small knee effusion.  Thin medial plica.

Popliteal Fossa:  Small Baker's cyst.

Extensor Mechanism:  Unremarkable

Bones: No significant extra-articular osseous abnormalities
identified. No supplemental non-categorized findings.

Other: No supplemental non-categorized findings.

Medial plica.
IMPRESSION: 1. Complex tear of the midbody and posterior horn medial meniscus
including a flap of meniscal tissue extending cephalad from the
posterior root adjacent to the PCL.
2. Mild levels of chondral thinning in the medial compartment and
patellofemoral joint.
3. Small knee effusion with small Baker's cyst.  Thin
